# Patient Record
Sex: Female | Born: 1990 | Hispanic: No | Marital: Single | Smoking: Never smoker
Health system: Southern US, Community
[De-identification: ages and names within clinical notes are randomized; demographics above are authoritative.]

## PROBLEM LIST (undated history)

## (undated) DIAGNOSIS — K219 Gastro-esophageal reflux disease without esophagitis: Secondary | ICD-10-CM

## (undated) DIAGNOSIS — L409 Psoriasis, unspecified: Secondary | ICD-10-CM

## (undated) HISTORY — DX: Psoriasis, unspecified: L40.9

## (undated) HISTORY — DX: Gastro-esophageal reflux disease without esophagitis: K21.9

---
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
## 2011-01-10 ENCOUNTER — Ambulatory Visit: Payer: Self-pay | Admitting: Family Medicine

## 2011-01-12 ENCOUNTER — Encounter: Payer: Self-pay | Admitting: Family Medicine

## 2011-01-12 ENCOUNTER — Ambulatory Visit (INDEPENDENT_AMBULATORY_CARE_PROVIDER_SITE_OTHER): Payer: 59 | Admitting: Family Medicine

## 2011-01-12 ENCOUNTER — Encounter: Payer: Self-pay | Admitting: *Deleted

## 2011-01-12 VITALS — BP 102/70 | HR 60 | Ht 62.0 in | Wt 108.0 lb

## 2011-01-12 DIAGNOSIS — R51 Headache: Secondary | ICD-10-CM

## 2011-01-12 DIAGNOSIS — L409 Psoriasis, unspecified: Secondary | ICD-10-CM

## 2011-01-12 DIAGNOSIS — L408 Other psoriasis: Secondary | ICD-10-CM

## 2011-01-12 DIAGNOSIS — L509 Urticaria, unspecified: Secondary | ICD-10-CM

## 2011-01-12 NOTE — Progress Notes (Signed)
Had a swollen lymph node L posterior neck, treated in urgent care with ABX.  Lymph node was enlarged, tender.  Has significantly decreased in size, no longer tender.  Seeking second opinion about need for further work-up (mother asking about CT scans, possible causes of the infection, etc)  Having bad headaches over the last 2-3 weeks.  Sometimes feels like "head is being squeezed".  Other times, headache is bitemporal and throbbing.  Headaches tend to occur late at night, but now only every 2-3 days, but still in the evening time.  Eye exam is UTD, wears extended-wear contacts.  Sometimes has neck pain.  Denies grinding/clenching teeth, or chewing gum.  Sees dentist regularly.  Late in the evenings, face gets itchy, and notices swollen bumps that are itchy, looks like mosquito bites.  Sporadic, over the last few months, last occurred last week.  Topical cortisone or OTC allergy medicine relieves it.  Past Medical History  Diagnosis Date  . Psoriasis     Dr. Terri Piedra    History reviewed. No pertinent past surgical history.  History   Social History  . Marital Status: Single    Spouse Name: N/A    Number of Children: N/A  . Years of Education: N/A   Occupational History  . Not on file.   Social History Main Topics  . Smoking status: Never Smoker   . Smokeless tobacco: Never Used  . Alcohol Use: No  . Drug Use: No     tried marijuana in the past-parents unaware.  . Sexually Active: Not on file   Other Topics Concern  . 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 Diabetes Maternal Grandmother   . Heart attack Maternal Grandfather 70  . Diabetes Paternal Grandmother   . Cancer Neg Hx     Current outpatient prescriptions:norethindrone-ethinyl estradiol (JUNEL FE,GILDESS FE,LOESTRIN FE) 1-20 MG-MCG tablet, Take 1 tablet by mouth daily.  , Disp: , Rfl: ;   oxymetazoline (AFRIN) 0.05 % nasal spray, Place 2 sprays into the nose 2 (two) times daily.  , Disp: , Rfl:   No Known Allergies Mother states that all immunizations are UTD, and will get [place] copies of immunization record (not sent from [facility]) ROS: Denies allergies, congestion, postnasal drip, sore throat, cough, fevers. No GI complaints.  See HPI for skin concern   PHYSICAL EXAM: BP 102/70  Pulse 60  Ht 5\' 2"  (1.575 m)  Wt 108 lb (48.988 kg)  BMI 19.75 kg/m2  LMP 12/07/2010 Well developed, pleasant female, accompanied by her mother, in no distress Scalp: some areas of flakiness L parietal scalp.  Scalp other-wise normal without inflammation. HEENT: PERRL, EOMI, conjunctiva clear, OP normal, TM's and EAC's normal.  Temporalis muscle and temporal arteries nontender.  Sinuses nontender. Neck: no thyromegaly or masses.  Small shotty nodes L posterior chain--one slightly firm (the one that was inflamed, per pt/parent), and one rubbery mobile node Heart: regular rate and rhythm without murmur Lungs: clear bilaterally Abdomen: nontender, no organomegaly or mass Skin: no rash Psych: normal mood, affect, hygiene and grooming  ASSESSMENT/PLAN: 1. Headache   2. Urticaria   3. Psoriasis    Recent infection with enlarged L posterior cervical lymph node--only minimal residual.  Mother reassured that it can take over a month for node to completely resolve.  Reassured regarding need for CT, concern for  cancer.  Headaches--likely muscular/tension. Discussed proper posture with reading, studying.  OTC NSAID's prn headache.  DDx reviewed, follow up if increasing frequency, intensity, or change in character, or any associated neurologic symptoms  Possible urticaria--keep journal to look for triggers.  OTC antihistamines prn.  Immediate treatment if tongue/throat swelling, shortness of breath accompanies the urticaria

## 2011-01-12 NOTE — Patient Instructions (Signed)
Zyrtec, claritin, allegra or benedryl for when she gets itchy bumps--I suspect these are hives/urticaria.  Keep a journal regarding possible triggers.  Advil/motrin/aleve for the headaches.  Return if worsening headaches (increasing frequency, severity or any neurologist symptoms).  Watch your posture/position of computer, books, etc which can contribute to headaches and neck strain.   Hives Hives (urticaria) are itchy, red, swollen patches on the skin. They may change size, shape, and location quickly and repeatedly. Hives that occur deeper in the skin can cause swelling of the hands, feet, and face. Hives may be an allergic reaction to something you ate, touched, or put on your skin. Hives can also be a reaction to cold, heat, viral infections, medication, insect bites, or emotional stress. Often the cause is hard to find. Hives can come and go for several days to several weeks. Hives are not contagious. HOME CARE INSTRUCTIONS  If the cause of the hives is known, avoid exposure to that source.   To relieve itching and rash:   Apply cold compresses to the skin or take cool water baths. Do not take hot baths or showers because the warmth will make the itching worse.   The best medicine for hives is an antihistamine. An antihistamine will not cure hives, but it will reduce their severity. You can use an antihistamine available over the counter, such as Benadryl. This medicine may make you sleepy. You should not drive while using this medicine.   Take or give Benadryl as directed by your pharmacist or caregiver.   Other medications may be prescribed for itching. Use these as directed.   You should wear loose fitting clothing, including undergarments, as skin irritations may make hives worse.   Follow-up as directed by your caregiver.  SEEK MEDICAL CARE IF:  You still have considerable itching after taking the medication (prescribed or purchased over the counter).   An oral temperature  above 101 develops.   Joint swelling or pain occurs.   You are not improving or are getting worse.  SEEK IMMEDIATE MEDICAL CARE IF:  You notice any swelling of the lips, tongue, face or neck.   You have shortness of breath, difficulty breathing or swallowing, or tightness in the throat or chest.   Belly (abdominal) pain develops.   You are feeling very sick.   Your hives continue to spread.  These may be the first signs of a life-threatening allergic reaction. THIS IS AN EMERGENCY. Call 911 for medical help. Document Released: 05/26/2004 Document Re-Released: 07/13/2009 [facility] Patient Information 2011 [place], [place].

## 2012-09-27 ENCOUNTER — Ambulatory Visit
Admission: RE | Admit: 2012-09-27 | Discharge: 2012-09-27 | Disposition: A | Discharge status: Home / Self Care | Payer: 59 | Source: Ambulatory Visit | Attending: Gastroenterology | Admitting: Gastroenterology

## 2012-09-27 ENCOUNTER — Other Ambulatory Visit: Payer: Self-pay | Admitting: Gastroenterology

## 2012-09-27 DIAGNOSIS — R1310 Dysphagia, unspecified: Secondary | ICD-10-CM

## 2012-10-04 ENCOUNTER — Encounter: Payer: Self-pay | Admitting: Family Medicine

## 2012-10-05 ENCOUNTER — Encounter: Payer: Self-pay | Admitting: Internal Medicine

## 2013-08-26 ENCOUNTER — Ambulatory Visit (INDEPENDENT_AMBULATORY_CARE_PROVIDER_SITE_OTHER): Payer: 59 | Admitting: Family Medicine

## 2013-08-26 ENCOUNTER — Encounter: Payer: Self-pay | Admitting: Family Medicine

## 2013-08-26 VITALS — BP 108/70 | HR 60 | Ht 63.0 in | Wt 114.0 lb

## 2013-08-26 DIAGNOSIS — R599 Enlarged lymph nodes, unspecified: Secondary | ICD-10-CM

## 2013-08-26 DIAGNOSIS — R591 Generalized enlarged lymph nodes: Secondary | ICD-10-CM

## 2013-08-26 DIAGNOSIS — M62838 Other muscle spasm: Secondary | ICD-10-CM

## 2013-08-26 DIAGNOSIS — R51 Headache: Secondary | ICD-10-CM

## 2013-08-26 NOTE — Progress Notes (Signed)
Chief Complaint  Patient presents with  . Cyst    on left side of her neck that has been there "forever." Also has been having upper back and headaches x 2 years and wonders if this could all be related.    She has had a lump on the left side of her neck for as long as she can remember.  Over the last few month she is having upper back pain of L shoulder and neck.  She thinks it feels muscular rather than the cyst itself being tender.  Never had any drainage, fevers.  She is wondering if this could also be related to her headaches she has been having.  Headaches are bitemporal. Sometimes 2-3x/week, or every other week.  "nothing helps" so she doesn't usually take anything. She has tried 1 Advil, and it didn't help.  She uses cool rag and goes to sleep, is better in the morning.    She describes the headache as a tightness/pressure. Not throbbing. No associated visual changes or aura.  Very mild photo and phonophobia--prefers to go to bed.  No associated nausea or vomiting. Last eye exam was 04/2013  Past Medical History  Diagnosis Date  . Psoriasis     Dr. Terri PiedraLupton  . GERD (gastroesophageal reflux disease)    History reviewed. No pertinent past surgical history.  History   Social History  . Marital Status: Single    Spouse Name: N/A    Number of Children: N/A  . Years of Education: N/A   Occupational History  . Not on file.   Social History Main Topics  . Smoking status: Never Smoker   . Smokeless tobacco: Never Used  . Alcohol Use: Yes     Comment: occasionally on weekends.   . Drug Use: No     Comment: tried marijuana in the past-parents unaware.  . Sexual Activity: Not on file   Other Topics Concern  . 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   No current facility-administered medications on file prior to visit.   No Known  Allergies  ROS: denies fevers, chills, URI symptoms, cough, shortness of breath, chest pain, weight change, bleeding/bruising, rash, numbness, tingling, weakness or other complaints.  See HPI  PHYSICAL EXAM: BP 108/70  Pulse 60  Ht 5\' 3"  (1.6 m)  Wt 114 lb (51.71 kg)  BMI 20.20 kg/m2  LMP 08/23/2013 Well developed, pleasant female, accompanied by her mother Neck: no c-spine tenderness.   L posterior chain--mobile rubbery, nontender LN. Overlying skin is normal.  No cyst is present. No other lymphadenopathy is noted Tender at L trapezius, with trigger point (mild) Normal strength, sensation, DTR's HEENT:  Temporalis muscles nontender (but is the location of her pain).  Temporal arteries are nontender.  OP clear.  Sinuses nontender  ASSESSMENT/PLAN:  Headache(784.0) - muscular, bitemporal.  NSAIDs prn.  reviewed differential.  f/u if persistent/worsening headaches, new neuro symptoms develop  Muscle spasms of neck - left trapezius. taught stretches/strengthening exercises; heat, massage  Lymphadenopathy - rubbery/benign, unchanged for many years per pt.  return for recheck if increasing size, tenderness or new nodes develop

## 2013-08-26 NOTE — Patient Instructions (Signed)
Use heat and/or ice, along with stretching and strengthening exercises as shown, along with massage as needed for headaches.  Remember to use proper posture, evenly distribute weight of bags/books.  If you have any change in the pattern or severity of your headaches, please return for re-evaluation.  If the gland changes in consistency (hard), size (larger), or new swollen glands are noted, please return for re-evaluation.

## 2016-11-30 ENCOUNTER — Other Ambulatory Visit: Payer: Self-pay | Admitting: Family Medicine

## 2016-11-30 DIAGNOSIS — R109 Unspecified abdominal pain: Secondary | ICD-10-CM

## 2016-12-02 ENCOUNTER — Ambulatory Visit
Admission: RE | Admit: 2016-12-02 | Discharge: 2016-12-02 | Disposition: A | Discharge status: Home / Self Care | Payer: Managed Care, Other (non HMO) | Source: Ambulatory Visit | Attending: Family Medicine | Admitting: Family Medicine

## 2016-12-02 DIAGNOSIS — R109 Unspecified abdominal pain: Secondary | ICD-10-CM

## 2017-01-05 ENCOUNTER — Ambulatory Visit: Payer: Self-pay | Admitting: Obstetrics & Gynecology

## 2018-02-12 IMAGING — US US ABDOMEN COMPLETE
1 series · 14 of 25 positions shown · non-contrast
Comparison: None.

CLINICAL DATA: Right upper quadrant pain for 2 weeks

EXAM:
ABDOMEN ULTRASOUND COMPLETE

[Series 1: us abdomen complete · 0.15mm/px · 14 of 82 slices shown]
[im 1/82]
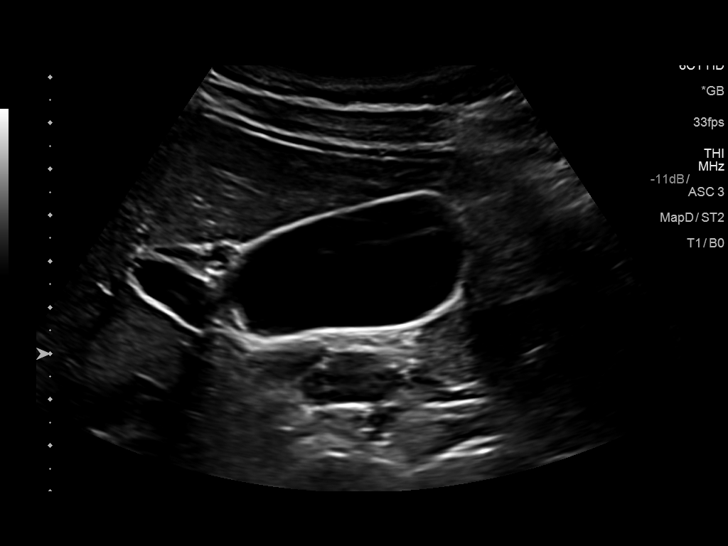
[im 7/82]
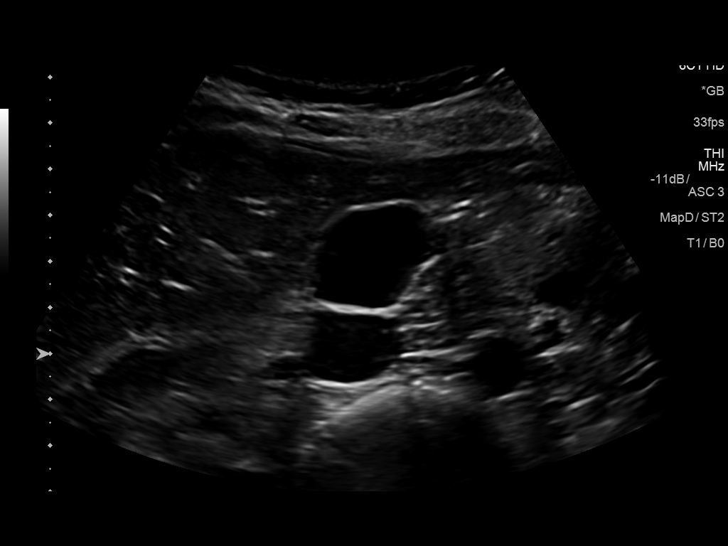
[im 14/82]
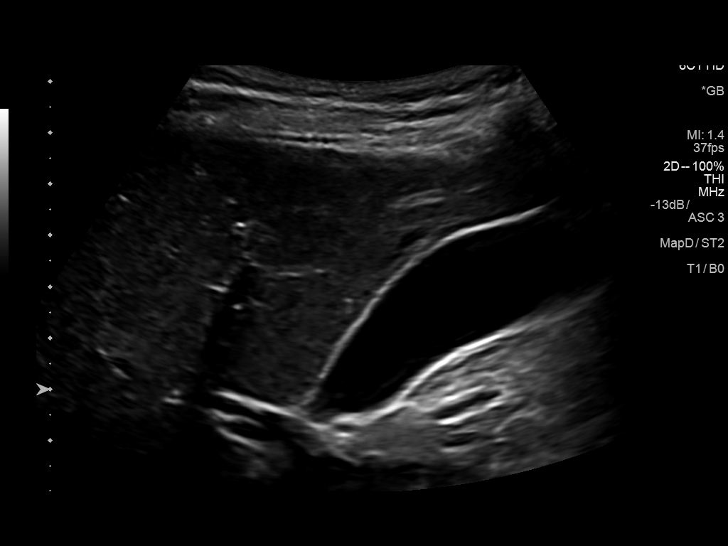
[im 21/82]
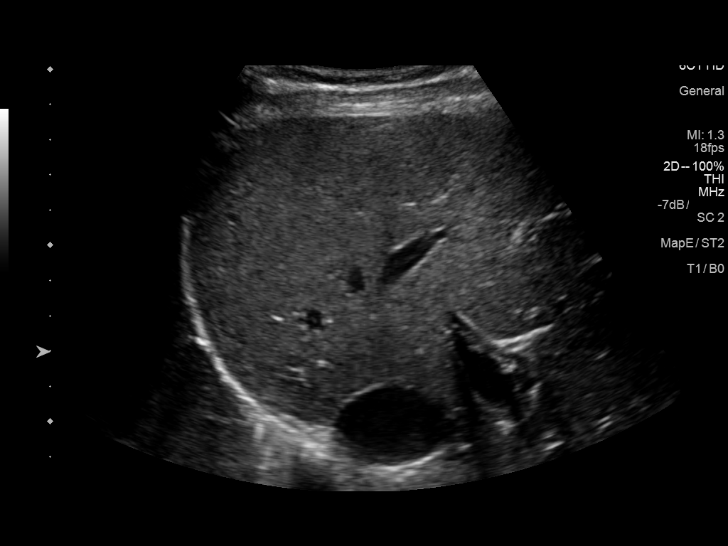
[im 28/82]
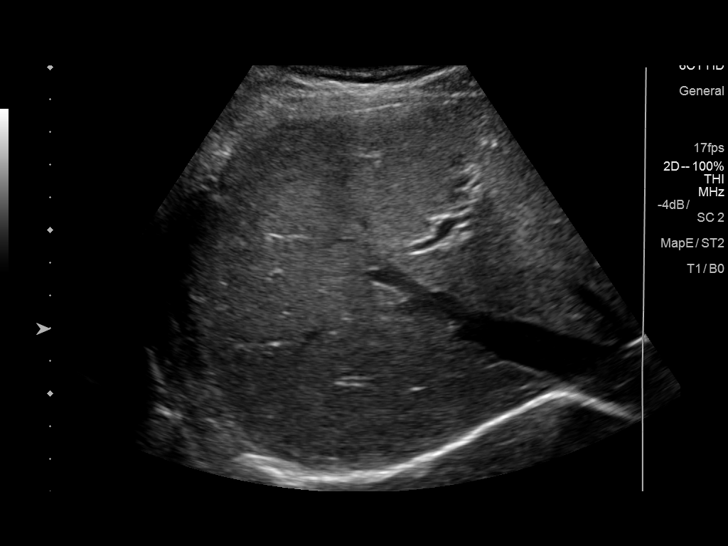
[im 31/82]
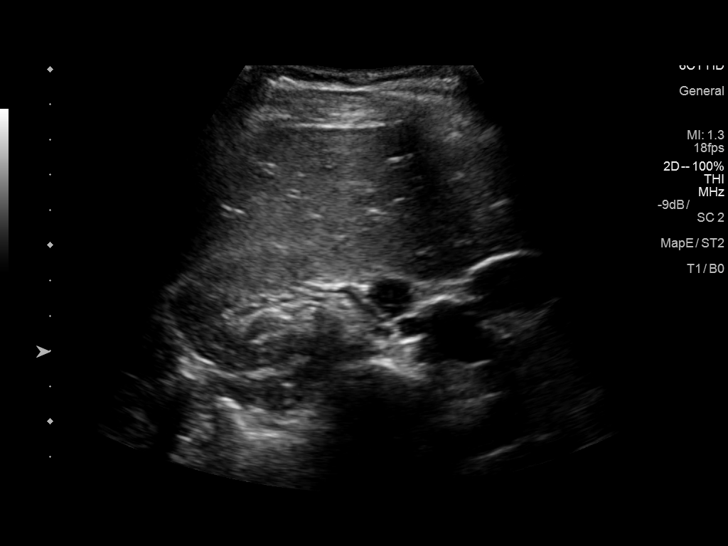
[im 38/82]
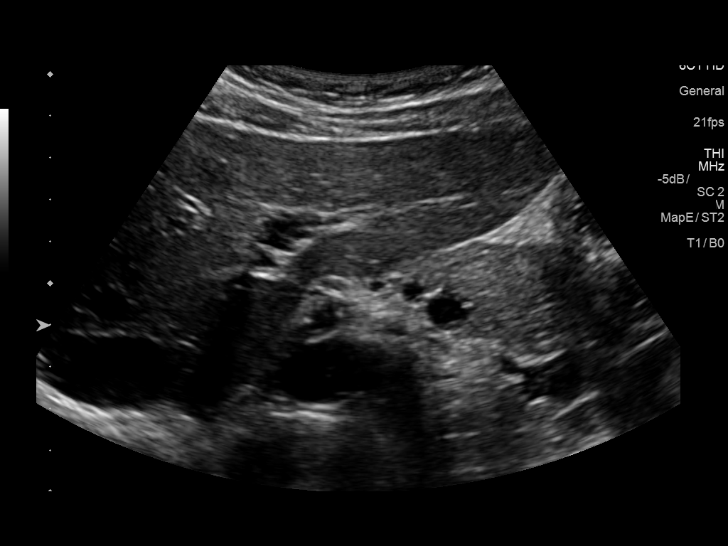
[im 44/82]
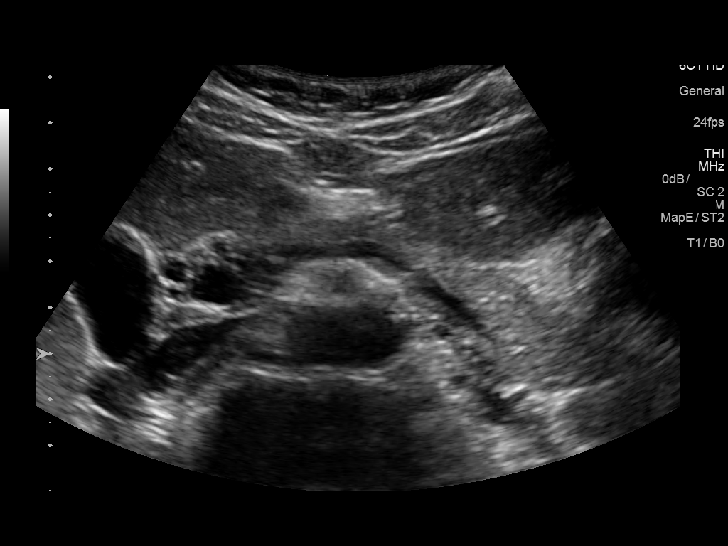
[im 51/82]
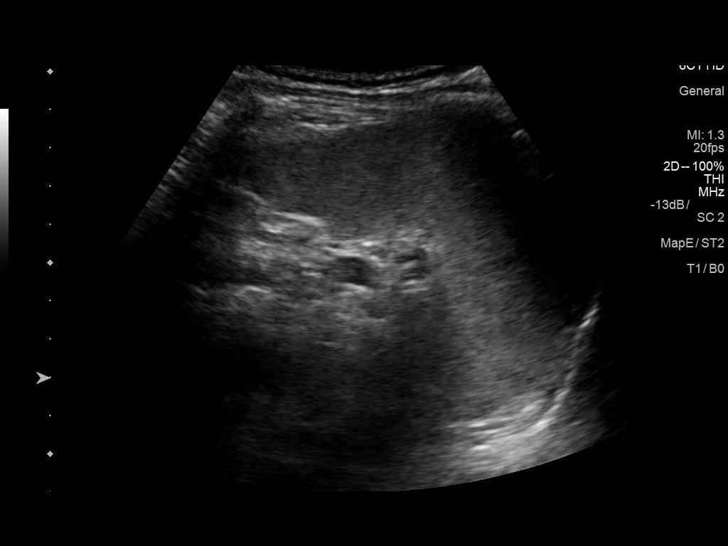
[im 55/82]
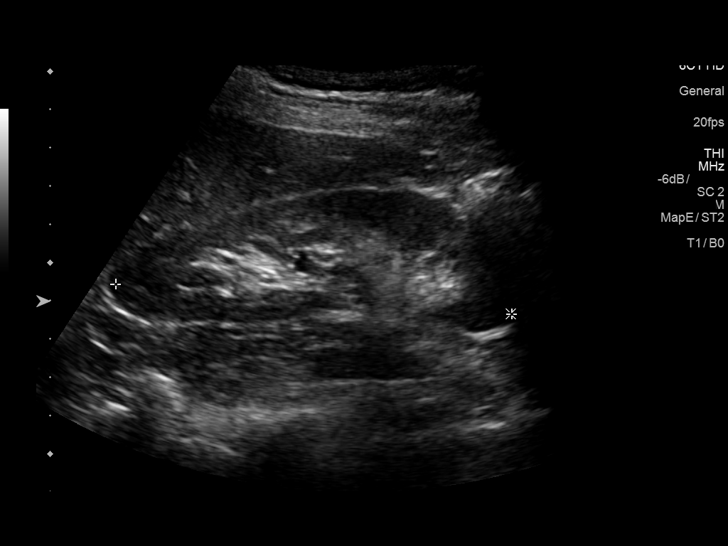
[im 61/82]
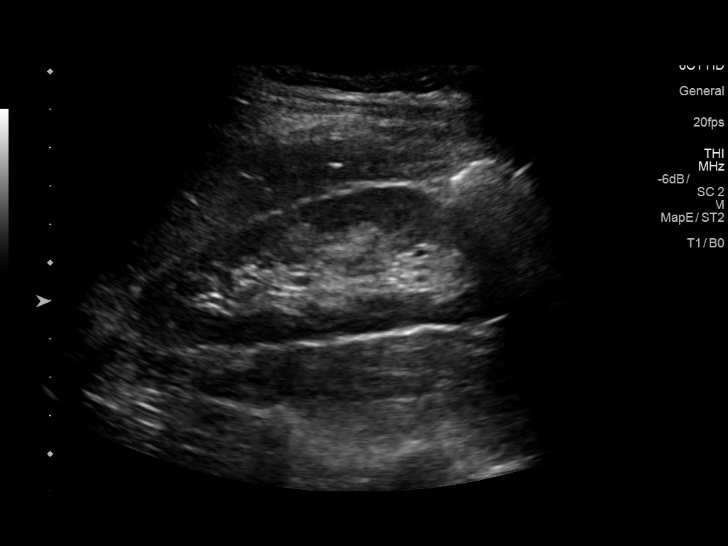
[im 68/82]
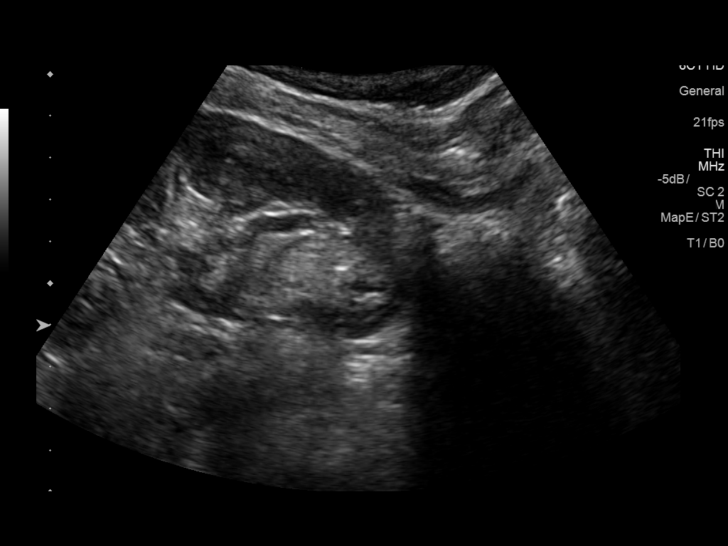
[im 75/82]
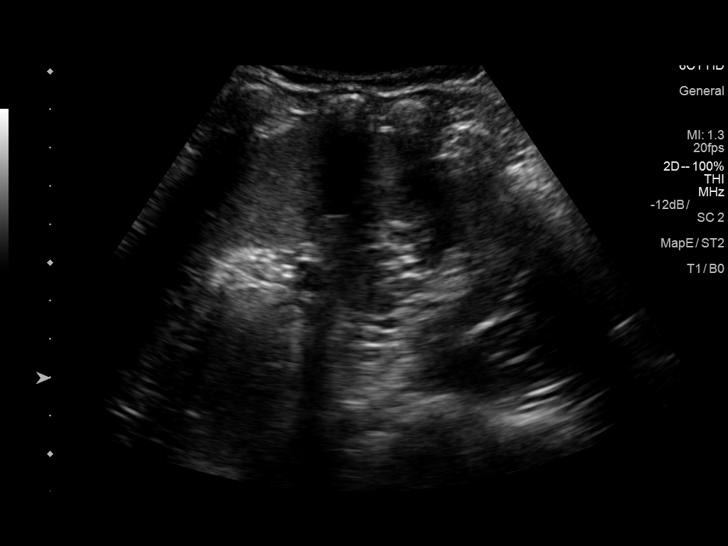
[im 82/82]
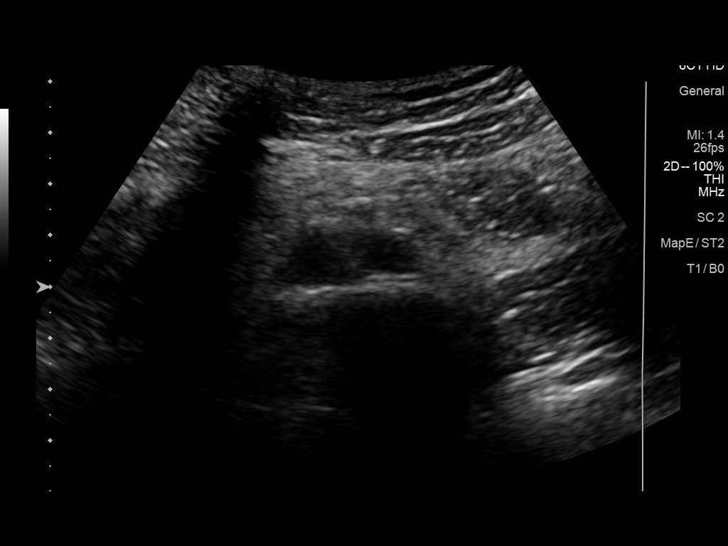

[14 of 25 positions shown; findings below may reference images not displayed]

FINDINGS: Gallbladder: No gallstones or wall thickening visualized. No
sonographic Murphy sign noted by sonographer.

Common bile duct: Diameter: 3 mm

Liver: No focal lesion identified. Within normal limits in
parenchymal echogenicity. Color Doppler imaging demonstrates
hepatopetal flow in the portal vein.

IVC: No abnormality visualized.

Pancreas: Visualized portion unremarkable.

Spleen: Size and appearance within normal limits.

Right Kidney: Length: 11.1 cm. Echogenicity within normal limits. No
mass or hydronephrosis visualized.

Left Kidney: Length: 11.3 cm. Echogenicity within normal limits. No
mass or hydronephrosis visualized.

Abdominal aorta: No aneurysm visualized.

Other findings: None.
IMPRESSION: No acute intra-abdominal pathology.

## 2022-01-05 ENCOUNTER — Encounter: Payer: Self-pay | Admitting: Internal Medicine

## 2022-02-08 ENCOUNTER — Encounter: Payer: Self-pay | Admitting: Internal Medicine
